# Patient Record
Sex: Female | Born: 2000 | Race: Black or African American | Hispanic: No | Marital: Single | Smoking: Never smoker
Health system: Southern US, Community
[De-identification: ages and names within clinical notes are randomized; demographics above are authoritative.]

---
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
## 2019-01-13 DIAGNOSIS — F4325 Adjustment disorder with mixed disturbance of emotions and conduct: Secondary | ICD-10-CM | POA: Diagnosis not present

## 2019-01-14 DIAGNOSIS — F4325 Adjustment disorder with mixed disturbance of emotions and conduct: Secondary | ICD-10-CM | POA: Diagnosis not present

## 2019-02-05 DIAGNOSIS — F4325 Adjustment disorder with mixed disturbance of emotions and conduct: Secondary | ICD-10-CM | POA: Diagnosis not present

## 2019-02-09 DIAGNOSIS — F4325 Adjustment disorder with mixed disturbance of emotions and conduct: Secondary | ICD-10-CM | POA: Diagnosis not present

## 2019-03-09 DIAGNOSIS — F4325 Adjustment disorder with mixed disturbance of emotions and conduct: Secondary | ICD-10-CM | POA: Diagnosis not present

## 2019-03-12 DIAGNOSIS — F4325 Adjustment disorder with mixed disturbance of emotions and conduct: Secondary | ICD-10-CM | POA: Diagnosis not present

## 2019-03-16 DIAGNOSIS — F4325 Adjustment disorder with mixed disturbance of emotions and conduct: Secondary | ICD-10-CM | POA: Diagnosis not present

## 2019-03-23 DIAGNOSIS — F4325 Adjustment disorder with mixed disturbance of emotions and conduct: Secondary | ICD-10-CM | POA: Diagnosis not present

## 2019-03-30 DIAGNOSIS — F4325 Adjustment disorder with mixed disturbance of emotions and conduct: Secondary | ICD-10-CM | POA: Diagnosis not present

## 2019-04-01 DIAGNOSIS — F4325 Adjustment disorder with mixed disturbance of emotions and conduct: Secondary | ICD-10-CM | POA: Diagnosis not present

## 2019-04-05 DIAGNOSIS — F4325 Adjustment disorder with mixed disturbance of emotions and conduct: Secondary | ICD-10-CM | POA: Diagnosis not present

## 2019-04-06 DIAGNOSIS — F4325 Adjustment disorder with mixed disturbance of emotions and conduct: Secondary | ICD-10-CM | POA: Diagnosis not present

## 2019-04-15 DIAGNOSIS — F4325 Adjustment disorder with mixed disturbance of emotions and conduct: Secondary | ICD-10-CM | POA: Diagnosis not present

## 2019-04-22 DIAGNOSIS — F4325 Adjustment disorder with mixed disturbance of emotions and conduct: Secondary | ICD-10-CM | POA: Diagnosis not present

## 2019-04-26 DIAGNOSIS — F4325 Adjustment disorder with mixed disturbance of emotions and conduct: Secondary | ICD-10-CM | POA: Diagnosis not present

## 2019-05-04 DIAGNOSIS — F4325 Adjustment disorder with mixed disturbance of emotions and conduct: Secondary | ICD-10-CM | POA: Diagnosis not present

## 2019-05-13 DIAGNOSIS — F4325 Adjustment disorder with mixed disturbance of emotions and conduct: Secondary | ICD-10-CM | POA: Diagnosis not present

## 2019-05-20 DIAGNOSIS — F4325 Adjustment disorder with mixed disturbance of emotions and conduct: Secondary | ICD-10-CM | POA: Diagnosis not present

## 2019-05-25 DIAGNOSIS — F4325 Adjustment disorder with mixed disturbance of emotions and conduct: Secondary | ICD-10-CM | POA: Diagnosis not present

## 2019-05-27 DIAGNOSIS — F4325 Adjustment disorder with mixed disturbance of emotions and conduct: Secondary | ICD-10-CM | POA: Diagnosis not present

## 2019-06-01 DIAGNOSIS — F4325 Adjustment disorder with mixed disturbance of emotions and conduct: Secondary | ICD-10-CM | POA: Diagnosis not present

## 2019-06-03 DIAGNOSIS — F4325 Adjustment disorder with mixed disturbance of emotions and conduct: Secondary | ICD-10-CM | POA: Diagnosis not present

## 2019-06-08 DIAGNOSIS — F4325 Adjustment disorder with mixed disturbance of emotions and conduct: Secondary | ICD-10-CM | POA: Diagnosis not present

## 2019-06-16 DIAGNOSIS — F4325 Adjustment disorder with mixed disturbance of emotions and conduct: Secondary | ICD-10-CM | POA: Diagnosis not present

## 2019-06-30 DIAGNOSIS — F4325 Adjustment disorder with mixed disturbance of emotions and conduct: Secondary | ICD-10-CM | POA: Diagnosis not present

## 2019-07-02 DIAGNOSIS — F4325 Adjustment disorder with mixed disturbance of emotions and conduct: Secondary | ICD-10-CM | POA: Diagnosis not present

## 2019-07-06 DIAGNOSIS — F4325 Adjustment disorder with mixed disturbance of emotions and conduct: Secondary | ICD-10-CM | POA: Diagnosis not present

## 2019-07-07 DIAGNOSIS — F4325 Adjustment disorder with mixed disturbance of emotions and conduct: Secondary | ICD-10-CM | POA: Diagnosis not present

## 2019-07-15 ENCOUNTER — Encounter: Payer: Self-pay | Admitting: Family Medicine

## 2019-07-15 ENCOUNTER — Other Ambulatory Visit: Payer: Self-pay

## 2019-07-15 ENCOUNTER — Ambulatory Visit (INDEPENDENT_AMBULATORY_CARE_PROVIDER_SITE_OTHER): Payer: Medicaid Other | Admitting: Family Medicine

## 2019-07-15 VITALS — BP 102/58 | HR 96 | Ht 61.0 in | Wt 159.5 lb

## 2019-07-15 DIAGNOSIS — Z0000 Encounter for general adult medical examination without abnormal findings: Secondary | ICD-10-CM

## 2019-07-15 DIAGNOSIS — Z23 Encounter for immunization: Secondary | ICD-10-CM | POA: Diagnosis not present

## 2019-07-15 DIAGNOSIS — Z3202 Encounter for pregnancy test, result negative: Secondary | ICD-10-CM | POA: Diagnosis not present

## 2019-07-15 DIAGNOSIS — Z114 Encounter for screening for human immunodeficiency virus [HIV]: Secondary | ICD-10-CM

## 2019-07-15 DIAGNOSIS — Z130 Encounter for screening for diseases of the blood and blood-forming organs and certain disorders involving the immune mechanism: Secondary | ICD-10-CM

## 2019-07-15 DIAGNOSIS — Z1329 Encounter for screening for other suspected endocrine disorder: Secondary | ICD-10-CM | POA: Diagnosis not present

## 2019-07-15 DIAGNOSIS — N926 Irregular menstruation, unspecified: Secondary | ICD-10-CM

## 2019-07-15 DIAGNOSIS — Z1159 Encounter for screening for other viral diseases: Secondary | ICD-10-CM | POA: Insufficient documentation

## 2019-07-15 DIAGNOSIS — Z3200 Encounter for pregnancy test, result unknown: Secondary | ICD-10-CM

## 2019-07-15 LAB — POCT URINE PREGNANCY: Preg Test, Ur: NEGATIVE

## 2019-07-15 MED ORDER — NORGESTIMATE-ETH ESTRADIOL 0.25-35 MG-MCG PO TABS
1.0000 | ORAL_TABLET | Freq: Every day | ORAL | 11 refills | Status: AC
Start: 2019-07-15 — End: ?

## 2019-07-15 MED ORDER — NORGESTIM-ETH ESTRAD TRIPHASIC 0.18/0.215/0.25 MG-25 MCG PO TABS: 1.0000 | ORAL_TABLET | Freq: Every day | ORAL | 11 refills | Status: DC

## 2019-07-15 NOTE — Addendum Note (Signed)
Addended by: Owens Shark, Britnay Magnussen on: 07/15/2019 05:23 PM   Modules accepted: Level of Service

## 2019-07-15 NOTE — Assessment & Plan Note (Signed)
Screen for Hepatitis C per USPSTF recommendations. Patient is low risk as she does not use IVD and is not sexually active.

## 2019-07-15 NOTE — Assessment & Plan Note (Signed)
Per USPSTF recommendations

## 2019-07-15 NOTE — Progress Notes (Signed)
Subjective:    Patient ID: Kirsten Richard, female    DOB: 2001-07-08, 18 y.o.   MRN: [id number]   CC: establish care, irregular period  HPI: [name] is a 18 year old woman presenting here to establish care with one complaint of irregular menstrual bleeding.  She has not had a period in the last month, and for the 2 months prior to that she had bleeding every day without cramping. Patient's menstrual history is that menarche began at age 56, she never had regular periods. Most of her early periods came about every 2 to 3 months, lasted about 7 days with some mild cramping in the first few days, but nothing that kept her from participating in her normal activities.  At age 18 she started on birth control pills which she was on until last August at age 18.  She was very happy with her birth control pills and only did not have them renewed because she did not have a doctor at that time.  She is interested in using birth control pills again, as she had good result last time and has no problem taking pills every day.  She has never been sexually active, she does not have high blood pressure, she is not a smoker, and she does not have a history of migraine headaches with aura.  Does occasionally have headaches that are frontal, but she believes this is related to dehydration, as she feels better when she drinks water. She denies coughing, sore throat, runny nose, SOB, CP, abdominal pain, n/v/d, dysuria, rashes, and joint pain.  She has no significant past medical history, no surgical history, family history is significant for high blood pressure, diabetes, heart disease, kidney disease, alcohol and drug abuse.  She does not use tobacco products, she smokes marijuana (about 3 times per week), no other recreational drug use, she drinks alcohol rarely maybe 3-4 times a year, but when she does this usually at a party and she does many shots.  She has never experienced an alcoholic blackout, just had two hangovers  in the past.  She graduated high school last year and is currently working for [employer].  She lives with her cousin and her cousin's 2 children.  She considers her cousin her best friend, like a second mother.  She says she has a good support system and is overall happy and content.  Smoking status reviewed  ROS: pertinent noted in the HPI   Past medical history, surgical, family, and social history reviewed and updated in the EMR as appropriate.  Objective:  BP (!) 102/58   Pulse 96   Ht 5\' 1"  (1.549 m)   Wt 159 lb 8 oz (72.3 kg)   LMP 05/15/2019   SpO2 97%   BMI 30.14 kg/m   Vitals and nursing note reviewed  General: NAD, pleasant, able to participate in exam Neck: thyroid gland smooth and palpable, no LAD Cardiac: RRR, S1 S2 present. normal heart sounds, no murmurs. Respiratory: CTAB, normal effort, No wheezes, rales or rhonchi Abdominal: soft, NT, ND, normal bowel sounds + Extremities: no edema or cyanosis. Skin: warm and dry, no rashes noted Neuro: alert, no obvious focal deficits Psych: Normal affect and mood   Assessment & Plan:  [name] is a 18 year old woman with no significant past medical history here to establish care with irregular uterine bleeding.  Irregular uterine bleeding Patient has history of irregular uterine bleeding, most likely due to ovarian dysfunction.  Patient has obesity, denies acne or  hirsutism, potentially could have PCOS.  Could potentially have thyroid disorder, however she does not complain of constipation or diarrhea, heat or cold intolerance, sweating, heart palpitations, change in hair/skin/nails, or fatigue. Will check CBC for anemia. - obtain urine pregnancy test, CBC, TSH - Sprintec  Encounter for hepatitis C screening test for low risk patient Screen for Hepatitis C per USPSTF recommendations. Patient is low risk as she does not use IVD and is not sexually active.  Screening for HIV (human immunodeficiency virus) Per USPSTF  recommendations  Health care maintenance Flu shot administered today. Reports that she received all other immunizations.   No pap smear indicated until 18 yo  Shirlean Mylar, MD [facility] PGY-1

## 2019-07-15 NOTE — Assessment & Plan Note (Signed)
Patient has history of irregular uterine bleeding, most likely due to ovarian dysfunction.  Patient has obesity, denies acne or hirsutism, potentially could have PCOS.  Could potentially have thyroid disorder, however she does not complain of constipation or diarrhea, heat or cold intolerance, sweating, heart palpitations, change in hair/skin/nails, or fatigue. Will check CBC for anemia. - obtain urine pregnancy test, CBC, TSH - Sprintec

## 2019-07-15 NOTE — Patient Instructions (Signed)
It was lovely meeting you! We discussed many things about your health today, if you have any questions, don't hesitate to call.  1. Start birth control pills for irregular bleeding.  2. I recommend finding a task that helps keep you from snacking when bored. For exercise I recommend walking 3 times a week for 30 minutes at a time  3. We did some lab tests to check for blood count and basic screening exams. If anything is abnormal, you will hear from [place]. Follow up in 1 year or sooner if any concerns.  Be Well!  Dr. Leary Roca   Dysfunctional Uterine Bleeding Dysfunctional uterine bleeding is abnormal bleeding from the uterus. Dysfunctional uterine bleeding includes:  A menstrual period that comes earlier or later than usual.  A menstrual period that is lighter or heavier than usual, or has large blood clots.  Vaginal bleeding between menstrual periods.  Skipping one or more menstrual periods.  Vaginal bleeding after sex.  Vaginal bleeding after menopause. Follow these instructions at home: Eating and drinking   Eat well-balanced meals. Include foods that are high in iron, such as liver, meat, shellfish, green leafy vegetables, and eggs.  To prevent or treat constipation, your health care provider may recommend that you: ? Drink enough fluid to keep your urine pale yellow. ? Take over-the-counter or prescription medicines. ? Eat foods that are high in fiber, such as beans, whole grains, and fresh fruits and vegetables. ? Limit foods that are high in fat and processed sugars, such as fried or sweet foods. Medicines  Take over-the-counter and prescription medicines only as told by your health care provider.  Do not change medicines without talking with your health care provider.  Aspirin or medicines that contain aspirin may make the bleeding worse. Do not take those medicines: ? During the week before your menstrual period. ? During your menstrual period.  If you were  prescribed iron pills, take them as told by your health care provider. Iron pills help to replace iron that your body loses because of this condition. Activity  If you need to change your sanitary pad or tampon more than one time every 2 hours: ? Lie in bed with your feet raised (elevated). ? Place a cold pack on your lower abdomen. ? Rest as much as possible until the bleeding stops or slows down.  Do not try to lose weight until the bleeding has stopped and your blood iron level is back to normal. General instructions   For two months, write down: ? When your menstrual period starts. ? When your menstrual period ends. ? When any abnormal vaginal bleeding occurs. ? What problems you notice.  Keep all follow up visits as told by your health care provider. This is important. Contact a health care provider if you:  Feel light-headed or weak.  Have nausea and vomiting.  Cannot eat or drink without vomiting.  Feel dizzy or have diarrhea while you are taking medicines.  Are taking birth control pills or hormones, and you want to change them or stop taking them. Get help right away if:  You develop a fever or chills.  You need to change your sanitary pad or tampon more than one time per hour.  Your vaginal bleeding becomes heavier, or your flow contains clots more often.  You develop pain in your abdomen.  You lose consciousness.  You develop a rash. Summary  Dysfunctional uterine bleeding is abnormal bleeding from the uterus.  It includes menstrual bleeding of  abnormal duration, volume, or regularity.  Bleeding after sex and after menopause are also considered dysfunctional uterine bleeding. This information is not intended to replace advice given to you by your health care provider. Make sure you discuss any questions you have with your health care provider. Document Released: 09/20/2000 Document Revised: 03/04/2018 Document Reviewed: 03/04/2018 Elsevier Patient  Education  2020 [employer].

## 2019-07-15 NOTE — Assessment & Plan Note (Signed)
Flu shot administered today. Reports that she received all other immunizations.   No pap smear indicated until 18 yo

## 2019-07-16 LAB — CBC WITH DIFFERENTIAL/PLATELET
Basophils Absolute: 0 10*3/uL (ref 0.0–0.2)
Basos: 1 %
EOS (ABSOLUTE): 0 10*3/uL (ref 0.0–0.4)
Eos: 1 %
Hematocrit: 30.3 % — ABNORMAL LOW (ref 34.0–46.6)
Hemoglobin: 10.2 g/dL — ABNORMAL LOW (ref 11.1–15.9)
Immature Grans (Abs): 0 10*3/uL (ref 0.0–0.1)
Immature Granulocytes: 0 %
Lymphocytes Absolute: 1.1 10*3/uL (ref 0.7–3.1)
Lymphs: 27 %
MCH: 28.5 pg (ref 26.6–33.0)
MCHC: 33.7 g/dL (ref 31.5–35.7)
MCV: 85 fL (ref 79–97)
Monocytes Absolute: 0.5 10*3/uL (ref 0.1–0.9)
Monocytes: 11 %
Neutrophils Absolute: 2.5 10*3/uL (ref 1.4–7.0)
Neutrophils: 60 %
Platelets: 403 10*3/uL (ref 150–450)
RBC: 3.58 x10E6/uL — ABNORMAL LOW (ref 3.77–5.28)
RDW: 11.8 % (ref 11.7–15.4)
WBC: 4.1 10*3/uL (ref 3.4–10.8)

## 2019-07-16 LAB — HEPATITIS C ANTIBODY (REFLEX): HCV Ab: <0.1 s/co ratio (ref 0.0–0.9)

## 2019-07-16 LAB — TSH: TSH: 0.872 u[IU]/mL (ref 0.450–4.500)

## 2019-07-16 LAB — HIV ANTIBODY (ROUTINE TESTING W REFLEX): HIV Screen 4th Generation wRfx: NONREACTIVE

## 2019-07-16 LAB — HCV COMMENT:

## 2019-07-19 NOTE — Progress Notes (Signed)
Left HIPPA compliant VM for patient. Follow up in 3 months. Anemia most likely related to menstrual cycle, all other labs WNL. OCPs initiated at this visit, will recheck CBC at next visit to see if anemia improved.

## 2019-07-28 DIAGNOSIS — F4325 Adjustment disorder with mixed disturbance of emotions and conduct: Secondary | ICD-10-CM | POA: Diagnosis not present

## 2019-07-30 ENCOUNTER — Telehealth: Payer: Self-pay

## 2019-07-30 NOTE — Telephone Encounter (Signed)
Returning a call to Dr. Chauncey Reading. I did not see a message in chart for pt. Pt did not leave a call back number. Ottis Stain, CMA

## 2019-07-30 NOTE — Telephone Encounter (Signed)
Discussed results with pt from last visit: all WNL except mild anemia, most likely related to complaint of menorrhagia. Pt has started sprintec and no problems currently. Suggested she follow up in December or January to check anemia.  Gladys Damme, MD [place], PGY-1

## 2019-08-02 DIAGNOSIS — F4325 Adjustment disorder with mixed disturbance of emotions and conduct: Secondary | ICD-10-CM | POA: Diagnosis not present

## 2019-08-04 DIAGNOSIS — F4325 Adjustment disorder with mixed disturbance of emotions and conduct: Secondary | ICD-10-CM | POA: Diagnosis not present

## 2019-10-12 DIAGNOSIS — F4325 Adjustment disorder with mixed disturbance of emotions and conduct: Secondary | ICD-10-CM | POA: Diagnosis not present

## 2019-10-19 DIAGNOSIS — F4325 Adjustment disorder with mixed disturbance of emotions and conduct: Secondary | ICD-10-CM | POA: Diagnosis not present

## 2019-10-26 DIAGNOSIS — F4325 Adjustment disorder with mixed disturbance of emotions and conduct: Secondary | ICD-10-CM | POA: Diagnosis not present

## 2019-11-02 DIAGNOSIS — F4325 Adjustment disorder with mixed disturbance of emotions and conduct: Secondary | ICD-10-CM | POA: Diagnosis not present

## 2020-01-19 DIAGNOSIS — F4325 Adjustment disorder with mixed disturbance of emotions and conduct: Secondary | ICD-10-CM | POA: Diagnosis not present

## 2020-02-21 DIAGNOSIS — F4325 Adjustment disorder with mixed disturbance of emotions and conduct: Secondary | ICD-10-CM | POA: Diagnosis not present

## 2020-02-28 DIAGNOSIS — F4325 Adjustment disorder with mixed disturbance of emotions and conduct: Secondary | ICD-10-CM | POA: Diagnosis not present

## 2020-03-08 DIAGNOSIS — F4325 Adjustment disorder with mixed disturbance of emotions and conduct: Secondary | ICD-10-CM | POA: Diagnosis not present

## 2020-03-21 DIAGNOSIS — F4325 Adjustment disorder with mixed disturbance of emotions and conduct: Secondary | ICD-10-CM | POA: Diagnosis not present

## 2020-03-28 DIAGNOSIS — F4325 Adjustment disorder with mixed disturbance of emotions and conduct: Secondary | ICD-10-CM | POA: Diagnosis not present

## 2020-03-30 DIAGNOSIS — F4325 Adjustment disorder with mixed disturbance of emotions and conduct: Secondary | ICD-10-CM | POA: Diagnosis not present

## 2020-04-04 DIAGNOSIS — F4325 Adjustment disorder with mixed disturbance of emotions and conduct: Secondary | ICD-10-CM | POA: Diagnosis not present

## 2020-04-05 DIAGNOSIS — F4325 Adjustment disorder with mixed disturbance of emotions and conduct: Secondary | ICD-10-CM | POA: Diagnosis not present

## 2020-05-18 DIAGNOSIS — F4325 Adjustment disorder with mixed disturbance of emotions and conduct: Secondary | ICD-10-CM | POA: Diagnosis not present

## 2021-07-31 DIAGNOSIS — H5213 Myopia, bilateral: Secondary | ICD-10-CM | POA: Diagnosis not present
